# Patient Record
Sex: Male | Born: 1955 | Race: White | Hispanic: No | Smoking: Current some day smoker
Health system: Southern US, Community
[De-identification: ages and names within clinical notes are randomized; demographics above are authoritative.]

## PROBLEM LIST (undated history)

## (undated) DIAGNOSIS — I10 Essential (primary) hypertension: Secondary | ICD-10-CM

---

## 2016-04-09 ENCOUNTER — Encounter: Payer: Self-pay | Admitting: Emergency Medicine

## 2016-04-09 ENCOUNTER — Emergency Department: Payer: Self-pay

## 2016-04-09 ENCOUNTER — Emergency Department
Admission: EM | Admit: 2016-04-09 | Discharge: 2016-04-09 | Disposition: A | Discharge status: Home / Self Care | Payer: Self-pay | Attending: Emergency Medicine | Admitting: Emergency Medicine

## 2016-04-09 DIAGNOSIS — I10 Essential (primary) hypertension: Secondary | ICD-10-CM | POA: Insufficient documentation

## 2016-04-09 DIAGNOSIS — Z7982 Long term (current) use of aspirin: Secondary | ICD-10-CM | POA: Insufficient documentation

## 2016-04-09 DIAGNOSIS — F17210 Nicotine dependence, cigarettes, uncomplicated: Secondary | ICD-10-CM | POA: Insufficient documentation

## 2016-04-09 DIAGNOSIS — Z79899 Other long term (current) drug therapy: Secondary | ICD-10-CM | POA: Insufficient documentation

## 2016-04-09 DIAGNOSIS — N200 Calculus of kidney: Secondary | ICD-10-CM | POA: Insufficient documentation

## 2016-04-09 HISTORY — DX: Essential (primary) hypertension: I10

## 2016-04-09 LAB — BASIC METABOLIC PANEL
ANION GAP: 13 (ref 5–15)
BUN: 22 mg/dL — ABNORMAL HIGH (ref 6–20)
CALCIUM: 9.4 mg/dL (ref 8.9–10.3)
CO2: 22 mmol/L (ref 22–32)
CREATININE: 1.28 mg/dL — AB (ref 0.61–1.24)
Chloride: 101 mmol/L (ref 101–111)
GFR, EST NON AFRICAN AMERICAN: 59 mL/min — AB (ref >60)
Glucose, Bld: 143 mg/dL — ABNORMAL HIGH (ref 65–99)
Potassium: 4.2 mmol/L (ref 3.5–5.1)
SODIUM: 136 mmol/L (ref 135–145)

## 2016-04-09 LAB — URINALYSIS COMPLETE WITH MICROSCOPIC (ARMC ONLY)
BILIRUBIN URINE: NEGATIVE
Bacteria, UA: NONE SEEN
GLUCOSE, UA: NEGATIVE mg/dL
LEUKOCYTES UA: NEGATIVE
NITRITE: NEGATIVE
Protein, ur: 30 mg/dL — AB
SPECIFIC GRAVITY, URINE: 1.014 (ref 1.005–1.030)
Squamous Epithelial / LPF: NONE SEEN
pH: 5 (ref 5.0–8.0)

## 2016-04-09 LAB — CBC
HCT: 47.5 % (ref 40.0–52.0)
Hemoglobin: 16.1 g/dL (ref 13.0–18.0)
MCH: 29 pg (ref 26.0–34.0)
MCHC: 33.8 g/dL (ref 32.0–36.0)
MCV: 85.7 fL (ref 80.0–100.0)
Platelets: 343 10*3/uL (ref 150–440)
RBC: 5.54 MIL/uL (ref 4.40–5.90)
RDW: 13.9 % (ref 11.5–14.5)
WBC: 19.7 10*3/uL — AB (ref 3.8–10.6)

## 2016-04-09 MED ORDER — OXYCODONE-ACETAMINOPHEN 5-325 MG PO TABS: 1.0000 | ORAL_TABLET | ORAL | Status: DC | PRN

## 2016-04-09 MED ORDER — TAMSULOSIN HCL 0.4 MG PO CAPS: 0.4000 mg | ORAL_CAPSULE | Freq: Every day | ORAL | Status: DC

## 2016-04-09 MED ORDER — ONDANSETRON HCL 4 MG PO TABS: 4.0000 mg | ORAL_TABLET | Freq: Three times a day (TID) | ORAL | Status: DC | PRN

## 2016-04-09 NOTE — ED Notes (Signed)
Pt in with co left flank pain since today denies any dysuria, did vomit x 1 no diarrhea.

## 2016-04-09 NOTE — ED Notes (Signed)
Discharge instructions reviewed with patient. Questions fielded by this RN. Patient verbalizes understanding of instructions. Patient discharged home in stable condition per Derrill KayGoodman MD. No acute distress noted at time of discharge.  Pt educated on getting PCP to get meds for htn.

## 2016-04-09 NOTE — ED Notes (Signed)
Pt states no PCP. Doenst take anything for BP.

## 2016-04-09 NOTE — ED Provider Notes (Signed)
[facility] Emergency Department Provider Note    ____________________________________________  Time seen: ~2155  I have reviewed the triage vital signs and the nursing notes.   HISTORY  Chief Complaint Flank Pain   History limited by: Not Limited   HPI Jason Stuart is a 60 y.o. male who presents to the emergency department today because of concerns for left flank pain. The pain started this morning. It came on gradually. It was located in the left flank with some radiation towards his groin. It was severe. It has gotten somewhat better since he has been waiting in the emergency department. It was accompanied by one episode of vomiting. He denies any fevers. He states he had similar pain roughly 40 years ago when he passed a kidney stone.     Past Medical History  Diagnosis Date  . Hypertension     There are no active problems to display for this patient.   No past surgical history on file.  Current Outpatient Rx  Name  Route  Sig  Dispense  Refill  . aspirin EC 81 MG tablet   Oral   Take 81 mg by mouth daily.         Marland Kitchen. CALCIUM-MAGNESIUM-ZINC PO   Oral   Take 1 tablet by mouth daily.         . vitamin B-12 (CYANOCOBALAMIN) 1000 MCG tablet   Oral   Take 1,000 mcg by mouth daily.         . vitamin E 400 UNIT capsule   Oral   Take 400 Units by mouth daily.           Allergies Review of patient's allergies indicates no known allergies.  No family history on file.  Social History Social History  Substance Use Topics  . Smoking status: Not on file  . Smokeless tobacco: Not on file  . Alcohol Use: Not on file    Review of Systems  Constitutional: Negative for fever. Cardiovascular: Negative for chest pain. Respiratory: Negative for shortness of breath. Gastrointestinal: Positive for left flank pain, nausea and vomiting Neurological: Negative for headaches, focal weakness or numbness.  10-point ROS otherwise  negative.  ____________________________________________   PHYSICAL EXAM:  VITAL SIGNS: ED Triage Vitals  Enc Vitals Group     BP 04/09/16 2057 221/132 mmHg     Pulse Rate 04/09/16 2057 82     Resp 04/09/16 2057 18     Temp 04/09/16 2057 98.3 F (36.8 C)     Temp Source 04/09/16 2057 Oral     SpO2 04/09/16 2057 98 %     Weight 04/09/16 2057 160 lb (72.576 kg)     Height 04/09/16 2057 5\' 10"  (1.778 m)     Head Cir --      Peak Flow --      Pain Score 04/09/16 2058 5   Constitutional: Alert and oriented. Well appearing and in no distress. Eyes: Conjunctivae are normal. PERRL. Normal extraocular movements. ENT   Head: Normocephalic and atraumatic.   Nose: No congestion/rhinnorhea.   Mouth/Throat: Mucous membranes are moist.   Neck: No stridor. Hematological/Lymphatic/Immunilogical: No cervical lymphadenopathy. Cardiovascular: Normal rate, regular rhythm.  No murmurs, rubs, or gallops. Respiratory: Normal respiratory effort without tachypnea nor retractions. Breath sounds are clear and equal bilaterally. No wheezes/rales/rhonchi. Gastrointestinal: Soft and nontender. No distention. There is no CVA tenderness. Genitourinary: Deferred Musculoskeletal: Normal range of motion in all extremities. No joint effusions.  No lower extremity tenderness nor edema. Neurologic:  Normal speech and language. No gross focal neurologic deficits are appreciated.  Skin:  Skin is warm, dry and intact. No rash noted. Psychiatric: Mood and affect are normal. Speech and behavior are normal. Patient exhibits appropriate insight and judgment.  ____________________________________________    LABS (pertinent positives/negatives)  Labs Reviewed  CBC - Abnormal; Notable for the following:    WBC 19.7 (*)    All other components within normal limits  BASIC METABOLIC PANEL - Abnormal; Notable for the following:    Glucose, Bld 143 (*)    BUN 22 (*)    Creatinine, Ser 1.28 (*)    GFR calc  non Af Amer 59 (*)    All other components within normal limits  URINALYSIS COMPLETEWITH MICROSCOPIC ([facility] ONLY) - Abnormal; Notable for the following:    Color, Urine YELLOW (*)    APPearance CLEAR (*)    Ketones, ur 1+ (*)    Hgb urine dipstick 2+ (*)    Protein, ur 30 (*)    All other components within normal limits     ____________________________________________   EKG  None  ____________________________________________    RADIOLOGY  CT renal IMPRESSION: Obstructing 5 x 5 x 7 mm left ureteral calculus at the L4-5 level. Marked hydronephrosis. Left nephrolithiasis.  ____________________________________________   PROCEDURES  Procedure(s) performed: None  Critical Care performed: No  ____________________________________________   INITIAL IMPRESSION / ASSESSMENT AND PLAN / ED COURSE  Pertinent labs & imaging results that were available during my care of the patient were reviewed by me and considered in my medical decision making (see chart for details).  Patient presented to the emergency department today because of concerns for left flank pain. By the time that I examine the patient he stated he did feel better. CT scan did show a 5 x 5 x 7 mm stone in the left ureter. No signs of infection on the blood work. Patient without a fever. Will plan to give the patient pain medication and antinausea medication and Flomax. Did discuss strict infection return precautions. Will give urology follow-up information.  ____________________________________________   FINAL CLINICAL IMPRESSION(S) / ED DIAGNOSES  Final diagnoses:  Kidney stone     Note: This dictation was prepared with Dragon dictation. Any transcriptional errors that result from this process are unintentional    Phineas SemenGraydon Tieisha Darden, MD 04/09/16 2350

## 2016-04-09 NOTE — Discharge Instructions (Signed)
Please seek medical attention for any high fevers, chest pain, shortness of breath, change in behavior, persistent vomiting, bloody stool or any other new or concerning symptoms. ° ° °Kidney Stones °Kidney stones (urolithiasis) are deposits that form inside your kidneys. The intense pain is caused by the stone moving through the urinary tract. When the stone moves, the ureter goes into spasm around the stone. The stone is usually passed in the urine.  °CAUSES  °· A disorder that makes certain neck glands produce too much parathyroid hormone (primary hyperparathyroidism). °· A buildup of uric acid crystals, similar to gout in your joints. °· Narrowing (stricture) of the ureter. °· A kidney obstruction present at birth (congenital obstruction). °· Previous surgery on the kidney or ureters. °· Numerous kidney infections. °SYMPTOMS  °· Feeling sick to your stomach (nauseous). °· Throwing up (vomiting). °· Blood in the urine (hematuria). °· Pain that usually spreads (radiates) to the groin. °· Frequency or urgency of urination. °DIAGNOSIS  °· Taking a history and physical exam. °· Blood or urine tests. °· CT scan. °· Occasionally, an examination of the inside of the urinary bladder (cystoscopy) is performed. °TREATMENT  °· Observation. °· Increasing your fluid intake. °· Extracorporeal shock wave lithotripsy--This is a noninvasive procedure that uses shock waves to break up kidney stones. °· Surgery may be needed if you have severe pain or persistent obstruction. There are various surgical procedures. Most of the procedures are performed with the use of small instruments. Only small incisions are needed to accommodate these instruments, so recovery time is minimized. °The size, location, and chemical composition are all important variables that will determine the proper choice of action for you. Talk to your health care provider to better understand your situation so that you will minimize the risk of injury to yourself  and your kidney.  °HOME CARE INSTRUCTIONS  °· Drink enough water and fluids to keep your urine clear or pale yellow. This will help you to pass the stone or stone fragments. °· Strain all urine through the provided strainer. Keep all particulate matter and stones for your health care provider to see. The stone causing the pain may be as small as a grain of salt. It is very important to use the strainer each and every time you pass your urine. The collection of your stone will allow your health care provider to analyze it and verify that a stone has actually passed. The stone analysis will often identify what you can do to reduce the incidence of recurrences. °· Only take over-the-counter or prescription medicines for pain, discomfort, or fever as directed by your health care provider. °· Keep all follow-up visits as told by your health care provider. This is important. °· Get follow-up X-rays if required. The absence of pain does not always mean that the stone has passed. It may have only stopped moving. If the urine remains completely obstructed, it can cause loss of kidney function or even complete destruction of the kidney. It is your responsibility to make sure X-rays and follow-ups are completed. Ultrasounds of the kidney can show blockages and the status of the kidney. Ultrasounds are not associated with any radiation and can be performed easily in a matter of minutes. °· Make changes to your daily diet as told by your health care provider. You may be told to: °¨ Limit the amount of salt that you eat. °¨ Eat 5 or more servings of fruits and vegetables each day. °¨ Limit the amount of meat,   poultry, fish, and eggs that you eat. °· Collect a 24-hour urine sample as told by your health care provider. You may need to collect another urine sample every 6-12 months. °SEEK MEDICAL CARE IF: °· You experience pain that is progressive and unresponsive to any pain medicine you have been prescribed. °SEEK IMMEDIATE  MEDICAL CARE IF:  °· Pain cannot be controlled with the prescribed medicine. °· You have a fever or shaking chills. °· The severity or intensity of pain increases over 18 hours and is not relieved by pain medicine. °· You develop a new onset of abdominal pain. °· You feel faint or pass out. °· You are unable to urinate. °  °This information is not intended to replace advice given to you by your health care provider. Make sure you discuss any questions you have with your health care provider. °  °Document Released: 09/28/2005 Document Revised: 06/19/2015 Document Reviewed: 03/01/2013 °Elsevier Interactive Patient Education ©2016 [employer] ° °

## 2018-06-13 IMAGING — CT CT RENAL STONE PROTOCOL
3 of 4 series · 8 of 46 positions shown, 15 images · non-contrast
Comparison: None.

CLINICAL DATA: Left flank pain, onset earlier today.

EXAM:
CT ABDOMEN AND PELVIS WITHOUT CONTRAST
TECHNIQUE: Multidetector CT imaging of the abdomen and pelvis was performed
following the standard protocol without IV contrast.

[Series 4: lung · axial · 0.71mm/px · z∈[-102,-42]mm · 4 of 21 slices shown, 9 images]
[im 5/21  soft-tissue]
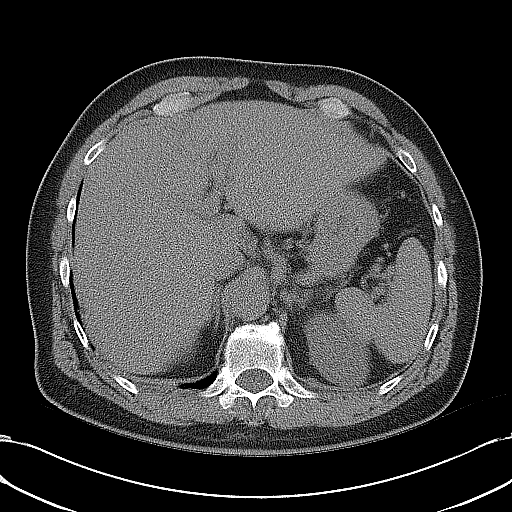
[im 5/21  lung]
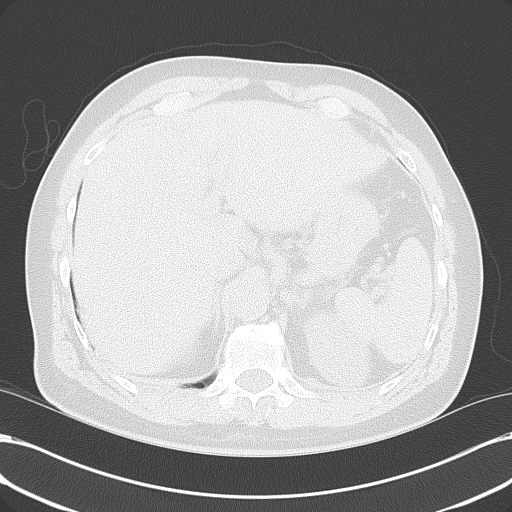
[im 5/21  bone]
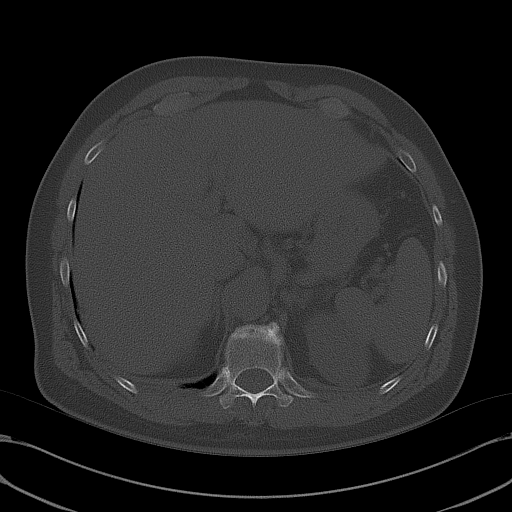
[im 9/21  soft-tissue]
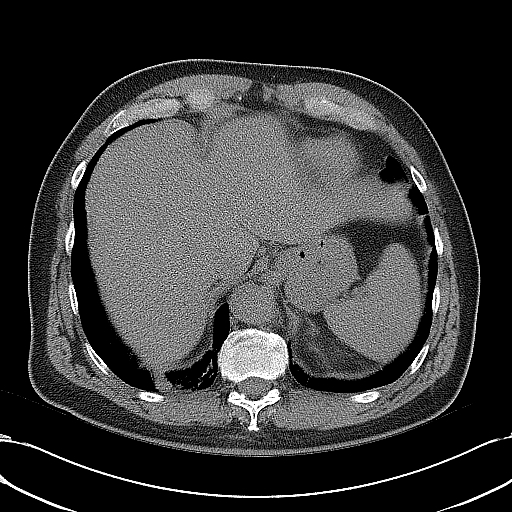
[im 9/21  lung]
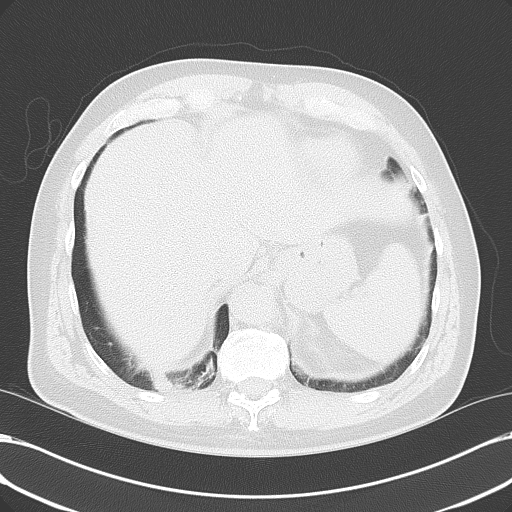
[im 13/21  soft-tissue]
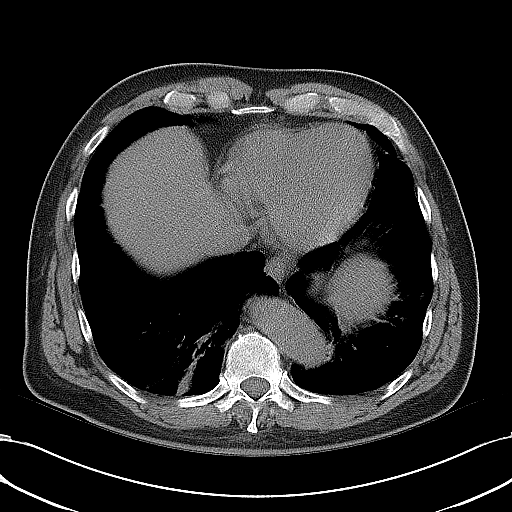
[im 13/21  lung]
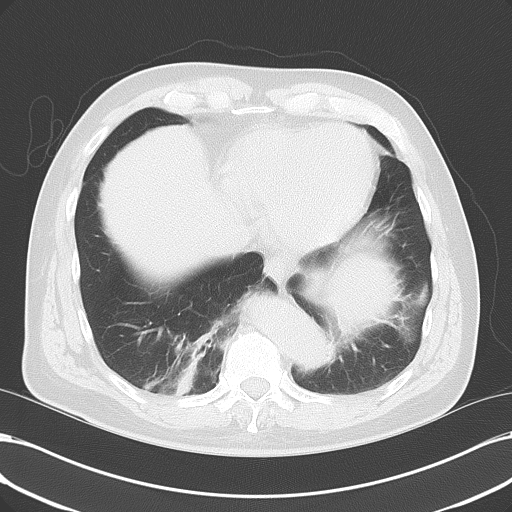
[im 17/21  soft-tissue]
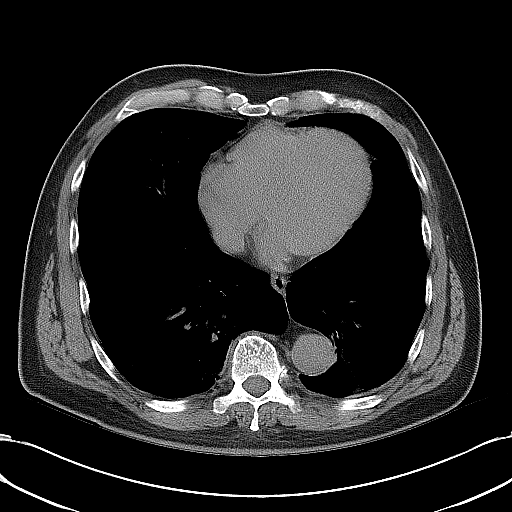
[im 17/21  lung]
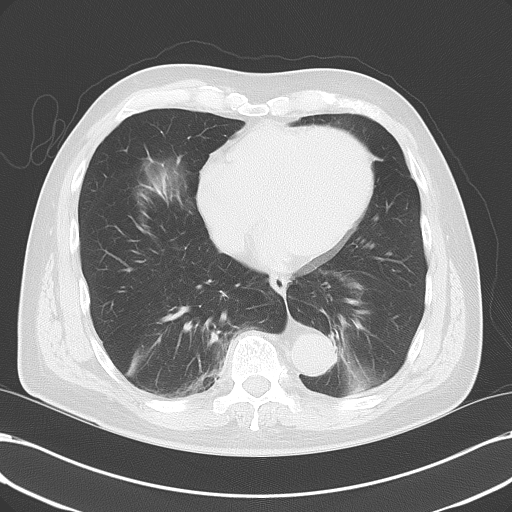

[Series 5: coronal · coronal · 0.88mm/px · 3 of 140 slices shown, 4 images]
[im 47/140  soft-tissue]
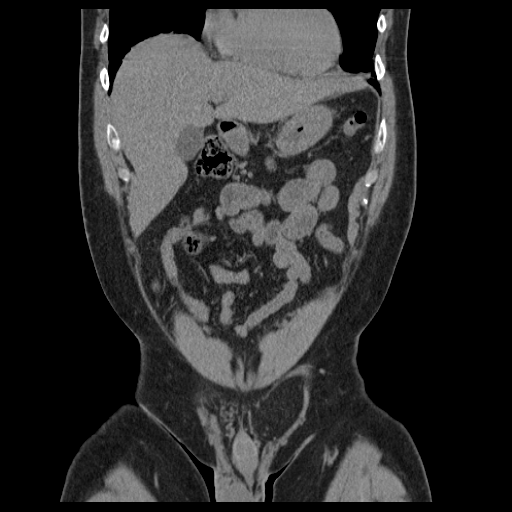
[im 62/140  soft-tissue]
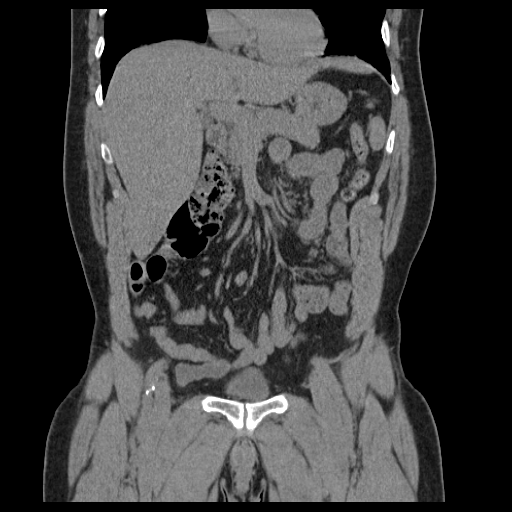
[im 62/140  bone]
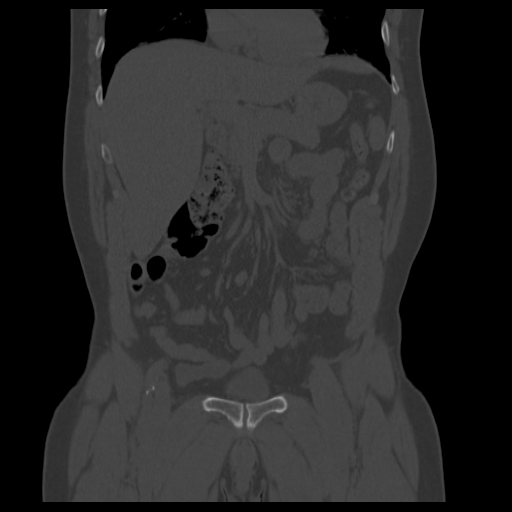
[im 78/140  soft-tissue]
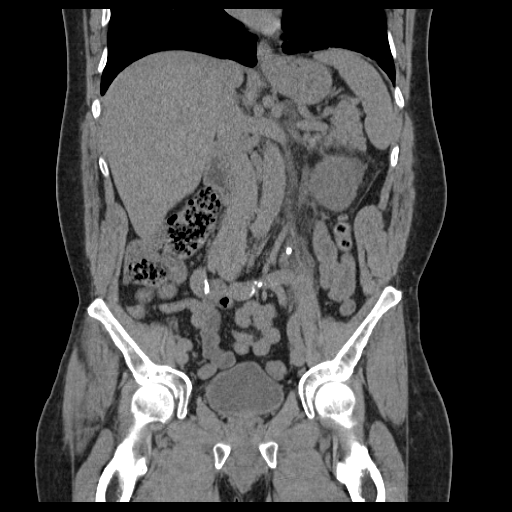

[Series 6: sagittal · sagittal · 0.88mm/px · 1 of 172 slices shown, 2 images]
[im 58/172  soft-tissue]
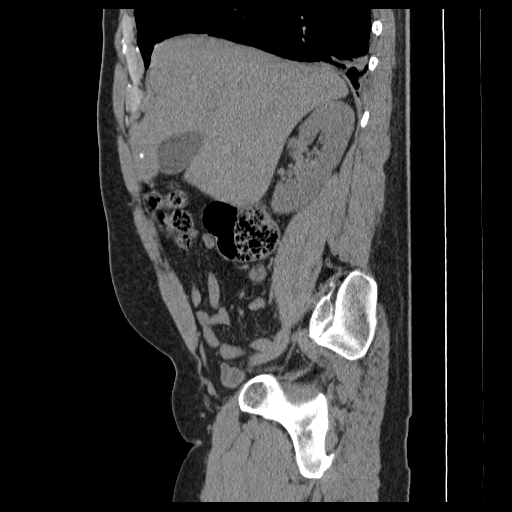
[im 58/172  bone]
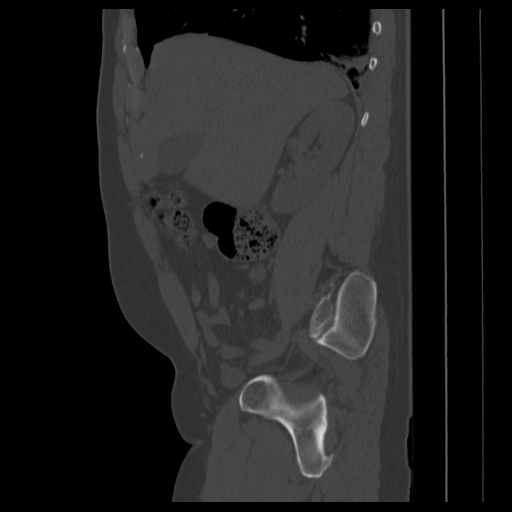

[8 of 46 positions shown; findings below may reference images not displayed]

FINDINGS: There is an obstructing left ureteral calculus at the L4-5 level
with marked hydronephrosis and proximal hydroureter. The calculus
measures 5 x 5 mm in short axis, 7 mm in long axis. No other acute
findings are evident in the abdomen or pelvis. There are at least 2
additional collecting system calculi in the left lower pole,
measuring 3-4 mm. No right urinary calculi are evident.

There are unremarkable unenhanced appearances of the liver,
gallbladder, bile ducts, pancreas, spleen and adrenals. Right kidney
is unremarkable.

The abdominal aorta is normal in caliber. There is mild
atherosclerotic calcification. There is no adenopathy in the abdomen
or pelvis.

There are normal appearances of the stomach, small bowel and colon.
The appendix is normal.

Moderate scarring or atelectasis in both lung bases. No significant
skeletal lesions.
IMPRESSION: Obstructing 5 x 5 x 7 mm left ureteral calculus at the L4-5 level.
Marked hydronephrosis. Left nephrolithiasis.

## 2022-12-11 DEATH — deceased
